# Patient Record
Sex: Male | Born: 1980 | Race: Black or African American | Hispanic: No | Marital: Single | Smoking: Current every day smoker
Health system: Southern US, Community
[De-identification: ages and names within clinical notes are randomized; demographics above are authoritative.]

---

## 2006-09-07 ENCOUNTER — Emergency Department (HOSPITAL_COMMUNITY): Admission: EM | Admit: 2006-09-07 | Discharge: 2006-09-07 | Payer: Self-pay | Admitting: Emergency Medicine

## 2007-12-17 ENCOUNTER — Emergency Department (HOSPITAL_COMMUNITY): Admission: EM | Admit: 2007-12-17 | Discharge: 2007-12-17 | Payer: Self-pay | Admitting: Emergency Medicine

## 2007-12-18 ENCOUNTER — Emergency Department (HOSPITAL_COMMUNITY): Admission: EM | Admit: 2007-12-18 | Discharge: 2007-12-18 | Payer: Self-pay | Admitting: Emergency Medicine

## 2009-05-21 IMAGING — CT CT ABDOMEN W/O CM
2 of 4 series · 14 of 32 positions shown, 19 images · non-contrast
Comparison: None

CT ABDOMEN

CLINICAL DATA: Abdominal pain and vomiting.

CT ABDOMEN AND PELVIS
TECHNIQUE: Multidetector CT imaging of the abdomen and pelvis was
performed using the standard protocol

[Series 2: >200 lbs stone · axial · 0.70mm/px · z∈[-470,-90]mm · 8 of 98 slices shown, 13 images]
[im 11/98  soft-tissue]
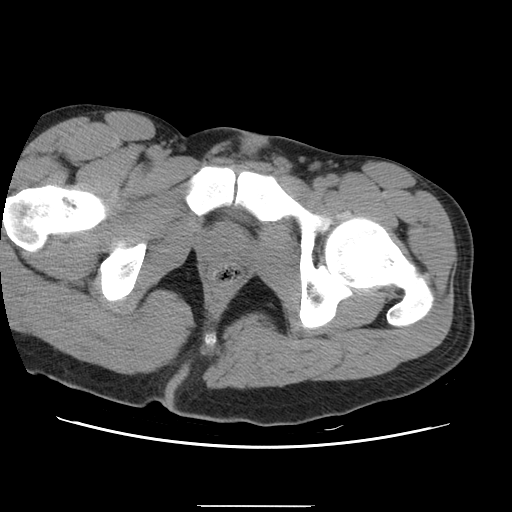
[im 11/98  bone]
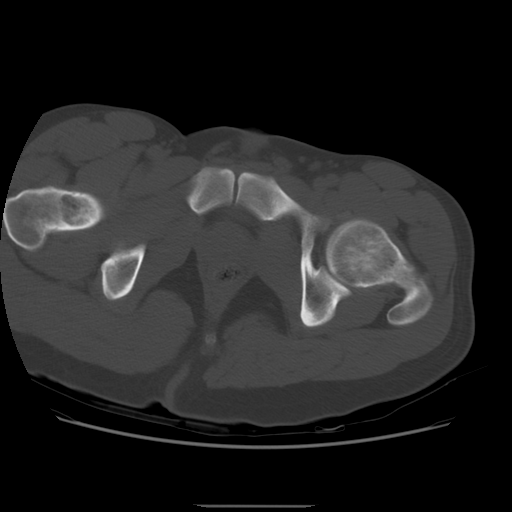
[im 22/98  soft-tissue]
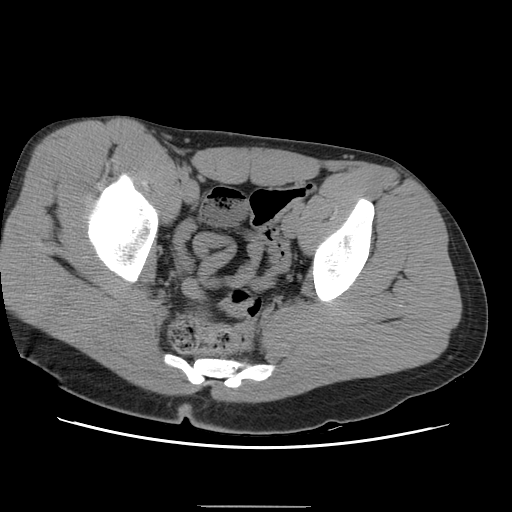
[im 33/98  soft-tissue]
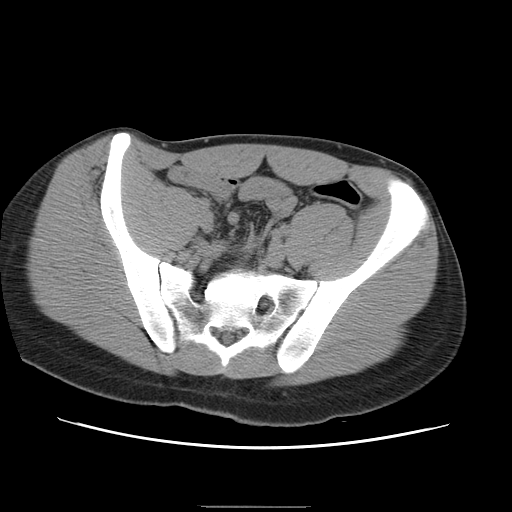
[im 44/98  soft-tissue]
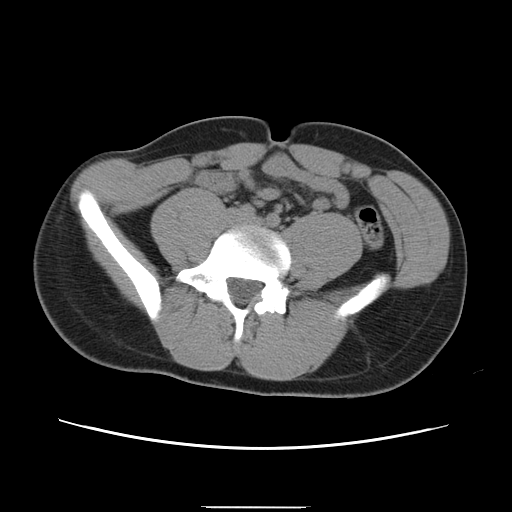
[im 54/98  soft-tissue]
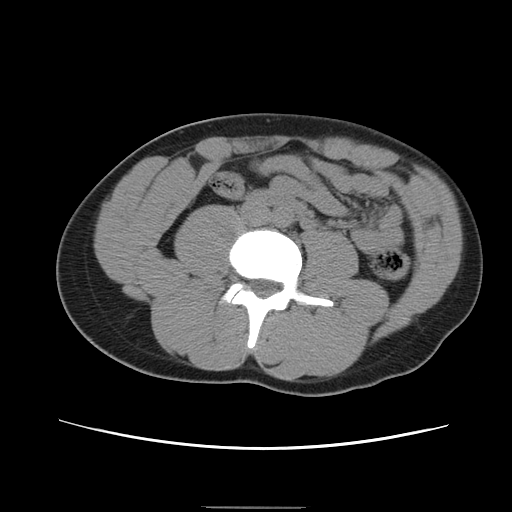
[im 54/98  lung]
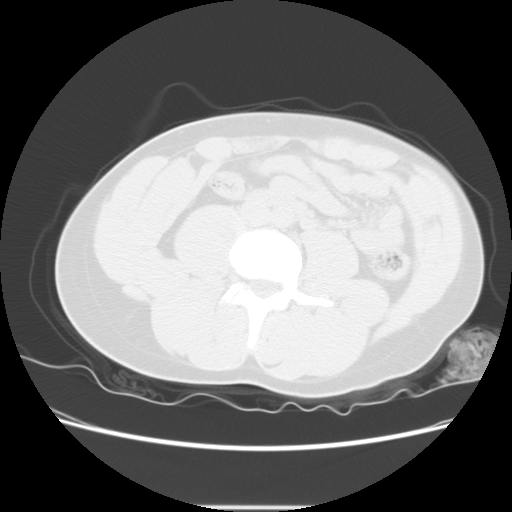
[im 65/98  soft-tissue]
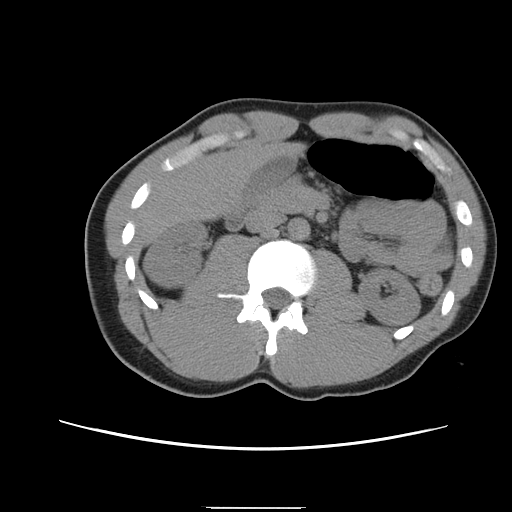
[im 65/98  lung]
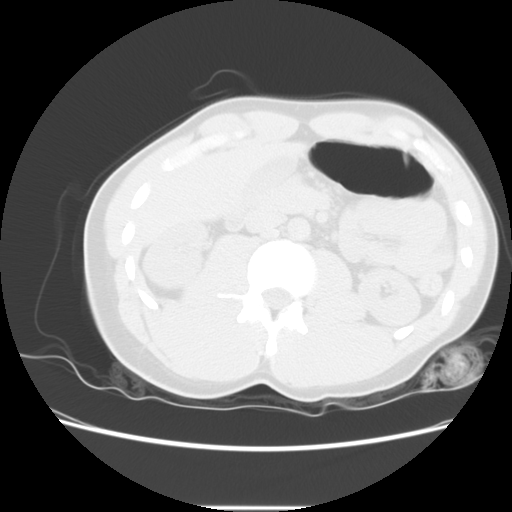
[im 76/98  soft-tissue]
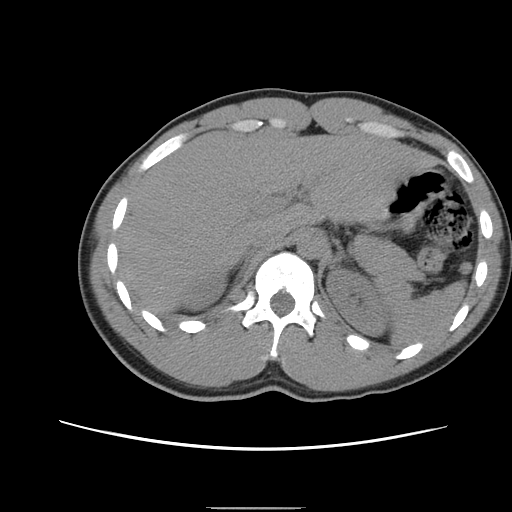
[im 76/98  lung]
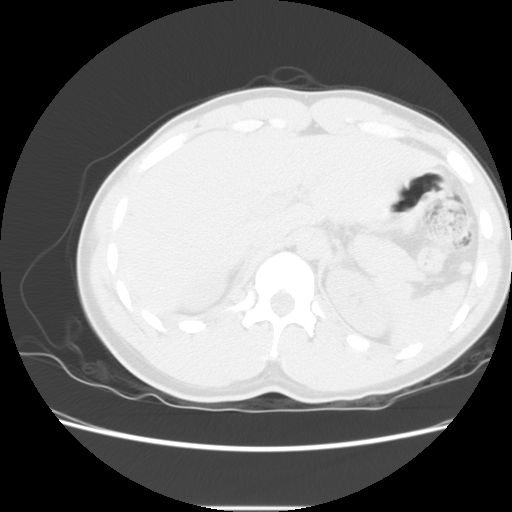
[im 87/98  soft-tissue]
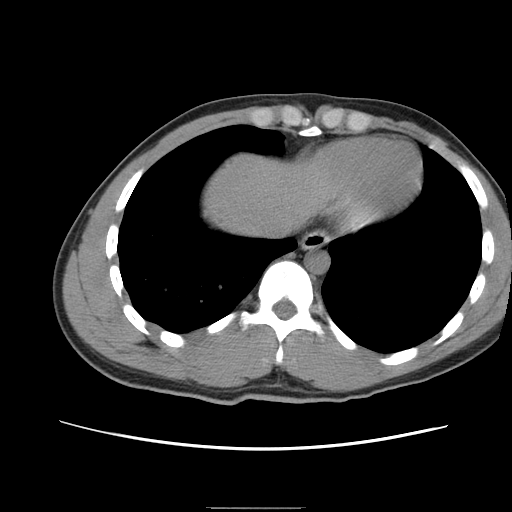
[im 87/98  lung]
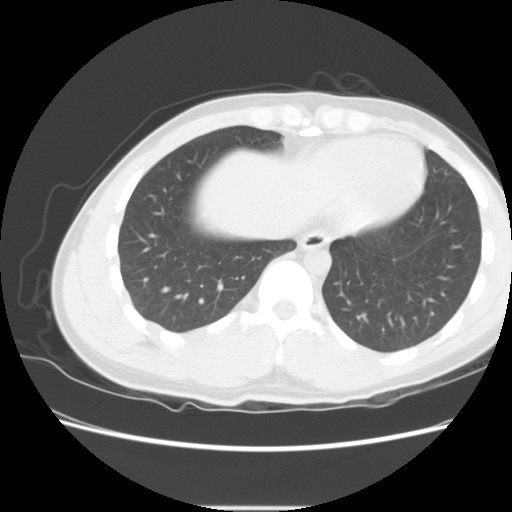

[Series 400: reformatted · sagittal · 0.99mm/px · 6 of 104 slices shown]
[im 11/104  soft-tissue]
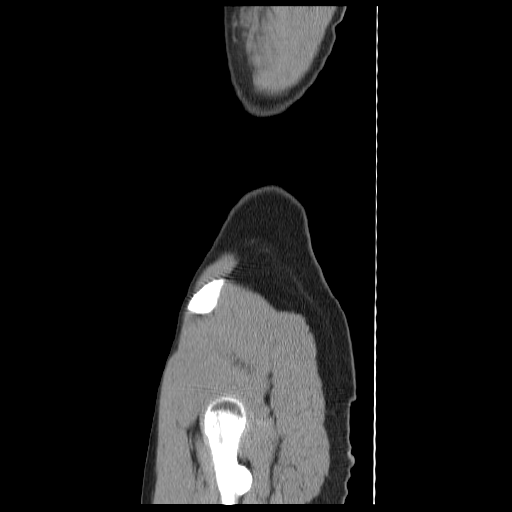
[im 21/104  soft-tissue]
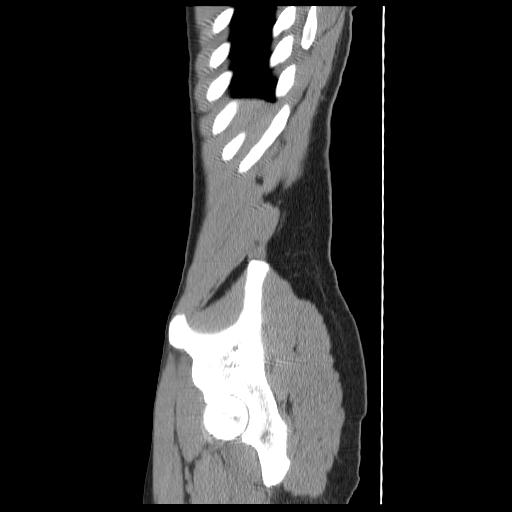
[im 31/104  soft-tissue]
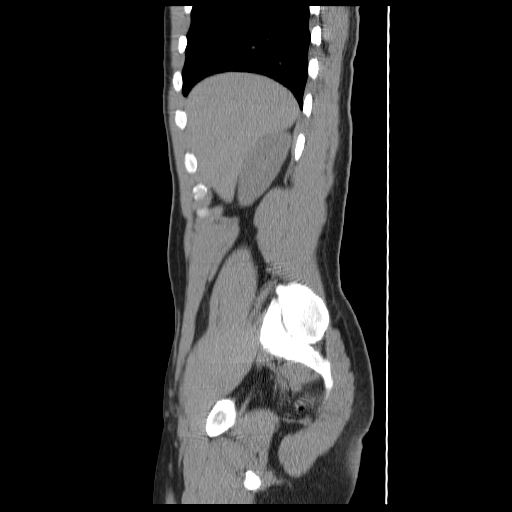
[im 42/104  soft-tissue]
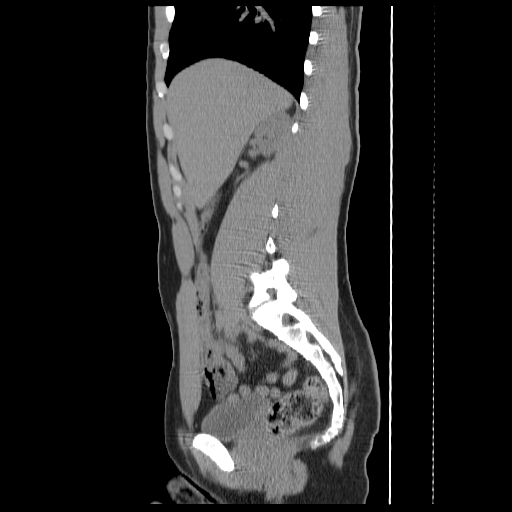
[im 62/104  soft-tissue]
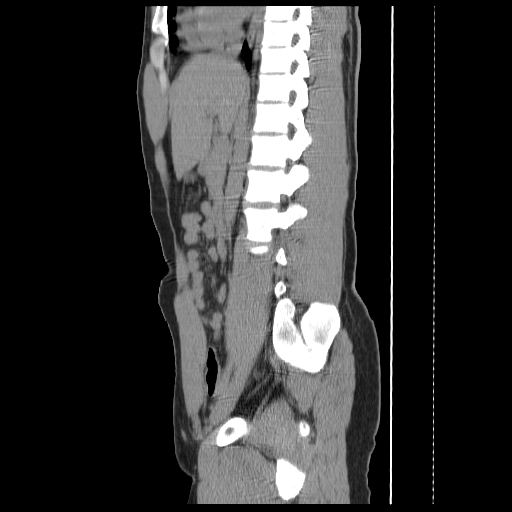
[im 73/104  soft-tissue]
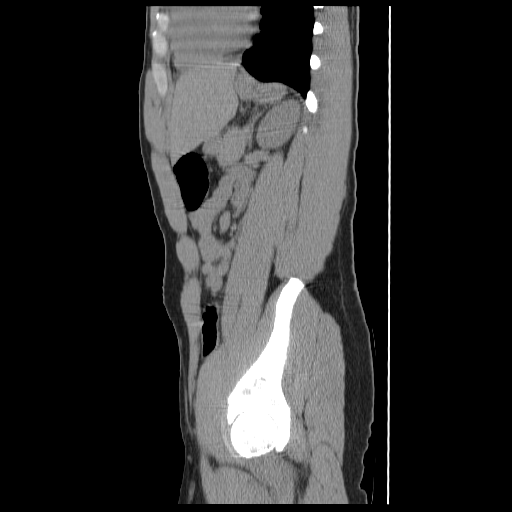

[14 of 32 positions shown; findings below may reference images not displayed]

FINDINGS: Liver, spleen, pancreas, adrenal glands, and kidneys
normal. Gallbladder unremarkable by CT. No biliary ductal dilation.
Stomach and visualized large and small bowel unremarkable.
Abdominal aorta normal in caliber. No significant lymphadenopathy.
No free fluid. Visualized lung bases clear.
IMPRESSION: Normal CT of the abdomen.

CT PELVIS
FINDINGS: Visualized colon and small bowel unremarkable. No free
fluid. No significant lymphadenopathy. Urinary bladder normal.
IMPRESSION: Normal CT of the pelvis.

## 2011-04-06 LAB — CBC
HCT: 49.1
HCT: 56.3 — ABNORMAL HIGH
Hemoglobin: 18.8 — ABNORMAL HIGH
MCHC: 34.6
MCV: 96.1
Platelets: 189
Platelets: 207
RDW: 13.2
RDW: 13.3
WBC: 13 — ABNORMAL HIGH

## 2011-04-06 LAB — DIFFERENTIAL
Basophils Absolute: 0.1
Basophils Relative: 0
Eosinophils Relative: 0
Eosinophils Relative: 0
Lymphocytes Relative: 14
Lymphs Abs: 1.9
Monocytes Absolute: 1
Monocytes Absolute: 1.2 — ABNORMAL HIGH
Monocytes Relative: 6
Neutro Abs: 13.1 — ABNORMAL HIGH
Neutro Abs: 9.9 — ABNORMAL HIGH
Neutrophils Relative %: 82 — ABNORMAL HIGH

## 2011-04-06 LAB — POCT I-STAT, CHEM 8
Calcium, Ion: 1.11 — ABNORMAL LOW
Chloride: 101
Glucose, Bld: 100 — ABNORMAL HIGH
Glucose, Bld: 144 — ABNORMAL HIGH
HCT: 52
HCT: 61 — ABNORMAL HIGH
Hemoglobin: 17.7 — ABNORMAL HIGH
Hemoglobin: 20.7 — ABNORMAL HIGH
Potassium: 3.8
Sodium: 135
Sodium: 137

## 2011-04-06 LAB — URINE MICROSCOPIC-ADD ON

## 2011-04-06 LAB — HEPATIC FUNCTION PANEL
ALT: 27
Bilirubin, Direct: 0.3
Total Bilirubin: 1.3 — ABNORMAL HIGH
Total Protein: 9.1 — ABNORMAL HIGH

## 2011-04-06 LAB — URINALYSIS, ROUTINE W REFLEX MICROSCOPIC
Leukocytes, UA: NEGATIVE
Specific Gravity, Urine: 1.037 — ABNORMAL HIGH
Urobilinogen, UA: 0.2
pH: 5.5

## 2011-05-02 ENCOUNTER — Emergency Department: Payer: Self-pay | Admitting: Emergency Medicine

## 2011-05-03 ENCOUNTER — Emergency Department (HOSPITAL_COMMUNITY)
Admission: EM | Admit: 2011-05-03 | Discharge: 2011-05-03 | Disposition: A | Discharge status: Home / Self Care | Payer: Self-pay | Attending: Emergency Medicine | Admitting: Emergency Medicine

## 2011-05-03 DIAGNOSIS — R630 Anorexia: Secondary | ICD-10-CM | POA: Insufficient documentation

## 2011-05-03 DIAGNOSIS — K644 Residual hemorrhoidal skin tags: Secondary | ICD-10-CM | POA: Insufficient documentation

## 2011-05-03 DIAGNOSIS — K625 Hemorrhage of anus and rectum: Secondary | ICD-10-CM | POA: Insufficient documentation

## 2011-05-09 ENCOUNTER — Ambulatory Visit (INDEPENDENT_AMBULATORY_CARE_PROVIDER_SITE_OTHER): Payer: Self-pay | Admitting: General Surgery

## 2011-05-09 ENCOUNTER — Encounter (INDEPENDENT_AMBULATORY_CARE_PROVIDER_SITE_OTHER): Payer: Self-pay | Admitting: General Surgery

## 2011-05-09 DIAGNOSIS — K648 Other hemorrhoids: Secondary | ICD-10-CM

## 2011-05-09 DIAGNOSIS — K644 Residual hemorrhoidal skin tags: Secondary | ICD-10-CM

## 2011-05-09 NOTE — Assessment & Plan Note (Deleted)
Injected circumferential internal hemorrhoids x3 Aggressive treatment of constipation. Proctofoam. Follow up in 6 weeks.

## 2011-05-09 NOTE — Assessment & Plan Note (Signed)
Injected circumferential internal hemorrhoids x3 Aggressive treatment of constipation. Reviewed staying off toilet for long periods of time.   Proctofoam. Follow up in 6 weeks.

## 2011-05-09 NOTE — Patient Instructions (Signed)
Take 100 mg Colace (stool softener) twice/day. Take Miralax (laxative) 17 gm once/day if stools are still hard or if straining to have BM. Drink 8 glasses water/day. Do not sit on toilet for long periods of time. Sitz baths (warm water soaks 2x/day)

## 2011-05-09 NOTE — Progress Notes (Signed)
Chief Complaint  Patient presents with  . Rectal Problems    Eval of hemmorhoids    HISTORY: The patient presents with around one year of complaints of hemorrhoids. He has had significant swelling that has gotten worse over the past 4 weeks. He complains of bleeding, pain, and swelling. He denies any rectal trauma. He has had significant constipation. Since going to the emergency department several weeks ago he has been feeling a little better. He started on stool softener once a day. He is still having constipation and still having to strain to have bowel movements. He is also on the ProctoFoam with some improvement but still has some bleeding. He denies any episodes of severe pain or any hard knots around his anus.  History reviewed. No pertinent past medical history.  History reviewed. No pertinent past surgical history.  Current Outpatient Prescriptions  Medication Sig Dispense Refill  . pramoxine (PROCTOFOAM) 1 % foam Place 1 application rectally 3 (three) times daily.           No Known Allergies   History reviewed. No pertinent family history.   History   Social History  . Marital Status: Single    Spouse Name: N/A    Number of Children: N/A  . Years of Education: N/A   Social History Main Topics  . Smoking status: Current Everyday Smoker -- 0.5 packs/day  . Smokeless tobacco: Never Used  . Alcohol Use: No  . Drug Use: No  . Sexually Active: None   Other Topics Concern  . None   Social History Narrative  . None     REVIEW OF SYSTEMS - PERTINENT POSITIVES ONLY: 12 point review of systems negative other than HPI and PMH.  EXAM: Filed Vitals:   05/09/11 1029  BP: 122/88  Pulse: 80  Temp: 97.2 F (36.2 C)  Resp: 14    Gen:  No acute distress.  Well nourished and well groomed.   Neurological: Alert and oriented to person, place, and time. Coordination normal.  Head: Normocephalic and atraumatic. Long hair in dredlocks.   Eyes: Conjunctivae are normal.  Pupils are equal, round, and reactive to light. No scleral icterus.  Neck: Normal range of motion.  Cardiovascular: Normal rate. Respiratory: Effort normal.  No respiratory distress.  GI:  The abdomen is soft and nontender.  There is no rebound and no guarding.  Rectal:  Circumferential large inflamed internal and external hemorrhoids.  Internal ones are injected with sclerosant during anoscopy.  No rectal masses.  No sign of thrombosis.   Musculoskeletal: Normal range of motion. Extremities are nontender.  Skin: Skin is warm and dry. No rash noted. No diaphoresis. No erythema. No pallor. No clubbing, cyanosis, or edema.   Psychiatric: Normal mood and affect. Behavior is normal. Judgment and thought content normal.    ASSESSMENT AND PLAN:   Internal and external bleeding hemorrhoids Injected circumferential internal hemorrhoids x3 Aggressive treatment of constipation. Reviewed staying off toilet for long periods of time.   Proctofoam. Follow up in 6 weeks.     Maudry Diego MD Surgical Oncology, General and Endocrine Surgery [facility]      Visit Diagnoses: 1. Hemorrhoids, internal, with bleeding   2. Internal and external bleeding hemorrhoids     Primary Care Physician: No primary provider on file.

## 2011-06-19 ENCOUNTER — Encounter (INDEPENDENT_AMBULATORY_CARE_PROVIDER_SITE_OTHER): Payer: Self-pay | Admitting: General Surgery

## 2011-07-17 ENCOUNTER — Ambulatory Visit (INDEPENDENT_AMBULATORY_CARE_PROVIDER_SITE_OTHER): Payer: PRIVATE HEALTH INSURANCE | Admitting: General Surgery

## 2011-07-17 ENCOUNTER — Encounter (INDEPENDENT_AMBULATORY_CARE_PROVIDER_SITE_OTHER): Payer: Self-pay | Admitting: General Surgery

## 2011-07-17 VITALS — BP 118/78 | HR 68 | Temp 97.6°F | Resp 18 | Ht 73.0 in | Wt 205.2 lb

## 2011-07-17 DIAGNOSIS — K644 Residual hemorrhoidal skin tags: Secondary | ICD-10-CM

## 2011-07-17 DIAGNOSIS — K648 Other hemorrhoids: Secondary | ICD-10-CM

## 2011-07-17 MED ORDER — HYDROCORTISONE 2.5 % RE CREA: TOPICAL_CREAM | Freq: Two times a day (BID) | RECTAL | Status: AC

## 2011-07-17 MED ORDER — PRAMOXINE HCL 1 % RE FOAM: 1.0000 "application " | Freq: Three times a day (TID) | RECTAL | Status: DC

## 2011-07-17 NOTE — Patient Instructions (Signed)
Hemorrhoids  Hemorrhoids are enlarged (dilated) veins around the rectum. There are 2 types of hemorrhoids, and the type of hemorrhoid is determined by its location. Internal hemorrhoids occur in the veins just inside the rectum.They are usually not painful, but they may bleed.However, they may poke through to the outside and become irritated and painful. External hemorrhoids involve the veins outside the anus and can be felt as a painful swelling or hard lump near the anus.They are often itchy and may crack and bleed. Sometimes clots will form in the veins. This makes them swollen and painful. These are called thrombosed hemorrhoids. CAUSES Causes of hemorrhoids include:  Pregnancy. This increases the pressure in the hemorrhoidal veins.   Constipation.   Straining to have a bowel movement.   Obesity.   Heavy lifting or other activity that caused you to strain.   TREATMENT Most of the time hemorrhoids improve in 1 to 2 weeks. However, if symptoms do not seem to be getting better or if you have a lot of rectal bleeding, your caregiver may perform a procedure to help make the hemorrhoids get smaller or remove them completely.Possible treatments include:  Rubber band ligation. A rubber band is placed at the base of the hemorrhoid to cut off the circulation.   Sclerotherapy. A chemical is injected to shrink the hemorrhoid.   Infrared light therapy. Tools are used to burn the hemorrhoid.   Hemorrhoidectomy. This is surgical removal of the hemorrhoid.   HOME CARE INSTRUCTIONS   Increase fiber in your diet. Ask your caregiver about using fiber supplements.   Drink enough water and fluids to keep your urine clear or pale yellow.   Exercise regularly.   Go to the bathroom when you have the urge to have a bowel movement. Do not wait.   Avoid straining to have bowel movements.   Keep the anal area dry and clean.   Only take over-the-counter or prescription medicines for pain,  discomfort, or fever as directed by your caregiver.   Take warm sitz baths for 20 to 30 minutes, 3 to 4 times per day.   If the hemorrhoids are very tender and swollen, place ice packs on the area as tolerated. Using ice packs between sitz baths may be helpful. Fill a plastic bag with ice. Place a towel between the bag of ice and your skin.   Medicated creams and suppositories may be used or applied as directed.   Do not use a donut-shaped pillow or sit on the toilet for long periods. This increases blood pooling and pain.   SEEK MEDICAL CARE IF:   You have increasing pain and swelling that is not controlled with your medicine.   You have uncontrolled bleeding.   You have difficulty or you are unable to have a bowel movement.   You have pain or inflammation outside the area of the hemorrhoids.   You have chills or an oral temperature above 102 F (38.9 C).     

## 2011-07-17 NOTE — Progress Notes (Signed)
Chief Complaint  Patient presents with  . Follow-up    Hemorrhoids    HISTORY: The patient is doing much better.  He states that he is around 70% better.  He is no longer having bleeding or pain.  He is having swelling still, and some issues cleaning.  He is taking his stool softeners daily.  He is doing sitz baths.  He overall is feeling better.    Past Medical History  Diagnosis Date  . Hemorrhoids     History reviewed. No pertinent past surgical history.  Current Outpatient Prescriptions  Medication Sig Dispense Refill  . hydrocortisone (ANUSOL-HC) 2.5 % rectal cream Place rectally 2 (two) times daily. Apply externally BID  30 g  3  . pramoxine (PROCTOFOAM) 1 % foam Place 1 application rectally 3 (three) times daily.  15 g  3     No Known Allergies   History reviewed. No pertinent family history.   History   Social History  . Marital Status: Single    Spouse Name: N/A    Number of Children: N/A  . Years of Education: N/A   Social History Main Topics  . Smoking status: Current Everyday Smoker -- 0.5 packs/day  . Smokeless tobacco: Never Used  . Alcohol Use: No  . Drug Use: No  . Sexually Active: None    REVIEW OF SYSTEMS - PERTINENT POSITIVES ONLY: 12 point review of systems negative other than HPI and PMH.  EXAM: Filed Vitals:   07/17/11 1350  BP: 118/78  Pulse: 68  Temp: 97.6 F (36.4 C)  Resp: 18    Gen:  No acute distress.  Well nourished and well groomed.   Neurological: Alert and oriented to person, place, and time. Coordination normal.  Head: Normocephalic and atraumatic. Long hair in dredlocks.   Eyes: Conjunctivae are normal. Pupils are equal, round, and reactive to light. No scleral icterus.  Neck: Normal range of motion.  Respiratory: Effort normal.  No respiratory distress.  Rectal:  Anoscopy is not performed today given improvement.  The external exam reveals circumferential external hemorrhoids, improved since last exam.  These are still  slightly inflamed appearing.   Skin: Skin is warm and dry. No rash noted. No diaphoresis. No erythema. No pallor. No clubbing, cyanosis, or edema.   Psychiatric: Normal mood and affect. Behavior is normal. Judgment and thought content normal.    ASSESSMENT AND PLAN:   Internal and external bleeding hemorrhoids Bleeding resolved. Will refill proctofoam. Pt with significant component of external hemorrhoids now.   Will write for anusol cream for external hemorrhoids. Continue stool softeners and sitz baths. Follow up as needed.      Maudry Diego MD Surgical Oncology, General and Endocrine Surgery [facility]      Visit Diagnoses: 1. Internal and external bleeding hemorrhoids     Primary Care Physician: Provider Not In System

## 2011-07-17 NOTE — Assessment & Plan Note (Signed)
Bleeding resolved. Will refill proctofoam. Pt with significant component of external hemorrhoids now.   Will write for anusol cream for external hemorrhoids. Continue stool softeners and sitz baths. Follow up as needed.

## 2012-06-20 ENCOUNTER — Emergency Department (HOSPITAL_COMMUNITY)
Admission: EM | Admit: 2012-06-20 | Discharge: 2012-06-20 | Disposition: A | Discharge status: Home / Self Care | Payer: Self-pay | Attending: Emergency Medicine | Admitting: Emergency Medicine

## 2012-06-20 ENCOUNTER — Encounter (HOSPITAL_COMMUNITY): Payer: Self-pay | Admitting: Emergency Medicine

## 2012-06-20 DIAGNOSIS — F17200 Nicotine dependence, unspecified, uncomplicated: Secondary | ICD-10-CM | POA: Insufficient documentation

## 2012-06-20 DIAGNOSIS — Z8679 Personal history of other diseases of the circulatory system: Secondary | ICD-10-CM | POA: Insufficient documentation

## 2012-06-20 DIAGNOSIS — K029 Dental caries, unspecified: Secondary | ICD-10-CM | POA: Insufficient documentation

## 2012-06-20 MED ORDER — HYDROCODONE-ACETAMINOPHEN 5-325 MG PO TABS
2.0000 | ORAL_TABLET | Freq: Once | ORAL | Status: AC
  Administered 2012-06-20: 2 via ORAL
  Filled 2012-06-20: qty 2

## 2012-06-20 MED ORDER — PENICILLIN V POTASSIUM 250 MG PO TABS
500.0000 mg | ORAL_TABLET | Freq: Four times a day (QID) | ORAL | Status: DC
  Administered 2012-06-20: 500 mg via ORAL
  Filled 2012-06-20: qty 2

## 2012-06-20 MED ORDER — PENICILLIN V POTASSIUM 500 MG PO TABS: 500.0000 mg | ORAL_TABLET | Freq: Three times a day (TID) | ORAL | Status: DC

## 2012-06-20 MED ORDER — HYDROCODONE-ACETAMINOPHEN 5-325 MG PO TABS: 2.0000 | ORAL_TABLET | ORAL | Status: DC | PRN

## 2012-06-20 NOTE — ED Notes (Signed)
Pt c/o right upper dental pain x 3 days.

## 2012-06-20 NOTE — ED Provider Notes (Signed)
History   This chart was scribed for Doug Sou, MD by Leone Payor, ED Scribe. This patient was seen in room TR10C/TR10C and the patient's care was started at 7:25PM.   CSN: [id number]  Arrival date & time 06/20/12  1811   None     Chief Complaint  Patient presents with  . Dental Pain    The history is provided by the patient. No language interpreter was used.   Randy Gregory is a 31 y.o. male who presents to the Emergency Department complaining of constant, gradually worsening, moderate right upper dental pain starting 3 days ago. Pt states taking taking Advil, ibuprofen, benadryl with no relief. Pt denies any fever. Pt denies having seen a dentist in the last few years.   Pt is a current everyday smoker but denies alcohol use. Denies using recreational drugs.  Past Medical History  Diagnosis Date  . Hemorrhoids     History reviewed. No pertinent past surgical history.  History reviewed. No pertinent family history.  History  Substance Use Topics  . Smoking status: Current Every Day Smoker -- 0.5 packs/day  . Smokeless tobacco: Never Used  . Alcohol Use: No      Review of Systems  Constitutional: Negative.  Negative for fever.  HENT: Positive for dental problem.   Respiratory: Negative.   Cardiovascular: Negative.   Gastrointestinal: Negative.   Musculoskeletal: Negative.   Skin: Negative.   Neurological: Negative.   Hematological: Negative.   Psychiatric/Behavioral: Negative.     Allergies  Review of patient's allergies indicates no known allergies.  Home Medications   Current Outpatient Rx  Name  Route  Sig  Dispense  Refill  . ACETAMINOPHEN 500 MG PO TABS   Oral   Take 1,000 mg by mouth every 6 (six) hours as needed. For pain         . ASPIRIN-ACETAMINOPHEN-CAFFEINE 250-250-65 MG PO TABS   Oral   Take 1 tablet by mouth every 6 (six) hours as needed. For pain         . BENZOCAINE 10 % MT GEL   Mouth/Throat   Use as directed 1 application  in the mouth or throat 3 (three) times daily as needed. For pain           BP 132/75  Pulse 116  Temp 98.6 F (37 C) (Oral)  Resp 18  SpO2 96%  Physical Exam  Nursing note and vitals reviewed. Constitutional: He appears well-developed and well-nourished.  HENT:  Head: Normocephalic and atraumatic.       Badly decayed right upper back 2 molars. No trismus.  Slight swelling of surrounding gingiva.   Eyes: Conjunctivae normal are normal. Pupils are equal, round, and reactive to light.  Neck: Neck supple. No tracheal deviation present. No thyromegaly present.  Cardiovascular: Normal rate and regular rhythm.   No murmur heard. Pulmonary/Chest: Effort normal and breath sounds normal.  Abdominal: Soft. Bowel sounds are normal. He exhibits no distension. There is no tenderness.  Musculoskeletal: Normal range of motion. He exhibits no edema and no tenderness.  Neurological: He is alert. Coordination normal.  Skin: Skin is warm and dry. No rash noted.  Psychiatric: He has a normal mood and affect.    ED Course  Procedures (including critical care time)  DIAGNOSTIC STUDIES: Oxygen Saturation is 96% on room air, adequate by my interpretation.    COORDINATION OF CARE:     Labs Reviewed - No data to display No results found.   No diagnosis  found.    MDM  Plan prescription Vicodin, penicillin Dental referral Dr.Beitez Diagnosis dental caries       I personally performed the services described in this documentation, which was scribed in my presence. The recorded information has been reviewed and is accurate.    Doug Sou, MD 06/20/12 2009

## 2013-10-29 ENCOUNTER — Encounter (HOSPITAL_COMMUNITY): Payer: Self-pay | Admitting: Emergency Medicine

## 2013-10-29 ENCOUNTER — Emergency Department (HOSPITAL_COMMUNITY)
Admission: EM | Admit: 2013-10-29 | Discharge: 2013-10-29 | Disposition: A | Discharge status: Home / Self Care | Payer: Self-pay | Attending: Emergency Medicine | Admitting: Emergency Medicine

## 2013-10-29 DIAGNOSIS — K0889 Other specified disorders of teeth and supporting structures: Secondary | ICD-10-CM

## 2013-10-29 DIAGNOSIS — K002 Abnormalities of size and form of teeth: Secondary | ICD-10-CM | POA: Insufficient documentation

## 2013-10-29 DIAGNOSIS — K089 Disorder of teeth and supporting structures, unspecified: Secondary | ICD-10-CM | POA: Insufficient documentation

## 2013-10-29 DIAGNOSIS — F17200 Nicotine dependence, unspecified, uncomplicated: Secondary | ICD-10-CM | POA: Insufficient documentation

## 2013-10-29 DIAGNOSIS — K029 Dental caries, unspecified: Secondary | ICD-10-CM | POA: Insufficient documentation

## 2013-10-29 MED ORDER — IBUPROFEN 800 MG PO TABS: 800.0000 mg | ORAL_TABLET | Freq: Three times a day (TID) | ORAL | Status: DC

## 2013-10-29 MED ORDER — PENICILLIN V POTASSIUM 500 MG PO TABS: 500.0000 mg | ORAL_TABLET | Freq: Four times a day (QID) | ORAL | Status: DC

## 2013-10-29 NOTE — ED Provider Notes (Signed)
Medical screening examination/treatment/procedure(s) were performed by non-physician practitioner and as supervising physician I was immediately available for consultation/collaboration.   EKG Interpretation None        Laray AngerKathleen M Murel Wigle, DO 10/29/13 1925

## 2013-10-29 NOTE — Discharge Instructions (Signed)
Take ibuprofen as needed for pain. Take penicillin as directed until gone. Follow up at the dental clinic. Refer to attached documents for more information.    Emergency Department Resource Guide 1) Find a Doctor and Pay Out of Pocket Although you won't have to find out who is covered by your insurance plan, it is a good idea to ask around and get recommendations. You will then need to call the office and see if the doctor you have chosen will accept you as a new patient and what types of options they offer for patients who are self-pay. Some doctors offer discounts or will set up payment plans for their patients who do not have insurance, but you will need to ask so you aren't surprised when you get to your appointment.  2) Contact Your Local Health Department Not all health departments have doctors that can see patients for sick visits, but many do, so it is worth a call to see if yours does. If you don't know where your local health department is, you can check in your phone book. The CDC also has a tool to help you locate your state's health department, and many state websites also have listings of all of their local health departments.  3) Find a Walk-in Clinic If your illness is not likely to be very severe or complicated, you may want to try a walk in clinic. These are popping up all over the country in pharmacies, drugstores, and shopping centers. They're usually staffed by nurse practitioners or physician assistants that have been trained to treat common illnesses and complaints. They're usually fairly quick and inexpensive. However, if you have serious medical issues or chronic medical problems, these are probably not your best option.  No Primary Care Doctor: - Call Health Connect at  [phone] - they can help you locate a primary care doctor that  accepts your insurance, provides certain services, etc. - Physician Referral Service- [phone]  Chronic Pain Problems: Organization          Address  Phone   Notes  [facility]  [phone]) [phone] Patients need to be referred by their primary care doctor.   Medication Assistance: Organization         Address  Phone   Notes  [facility] Medication [facility] [address] [place], [place] [address] [phone]) [phone] --Must be a resident of [facility] [place] -- Must have NO insurance coverage whatsoever (no Medicaid/ Medicare, etc.) -- The pt. MUST have a primary care doctor that directs their care regularly and follows them in the community   MedAssist  [phone]   [employer]  [phone]) [phone]    Agencies that provide inexpensive medical care: Organization         Address  Phone   Notes  [facility]  [phone]) [phone]   [facility]    [phone]   [facility] [address] [place], [place] [address] [phone]   [facility] [address], [place] [phone]   Planned Parenthood    [phone]   [facility]    [phone]   [facility]  [address], [place] Phone:  [phone], Fax:  [phone] Hours of Operation:  9 am - 6 pm, M-F.  Also accepts Medicaid/Medicare and self-pay.  [facility]  [address],  [place] Phone: [phone](336) [phone], Fax: [phone]) [phone]. Hours of Operation:  8:30 am - 5:30 pm, M-F.  Also accepts Medicaid and self-pay.  [facility] [place] [address], [place] Phone: [phone]) [phone]   [facility] [address], [place], [place] [phone], Ext. [phone] Mondays & Thursdays: 7-9 AM.  First 15 patients are seen on a first come, first serve basis.    Medicaid-accepting [facility] Providers:  Organization         Address  Phone   Notes  [facility] [address], [place] [phone]) [phone] Also accepts self-pay patients.    [facility] [address], [place]  [phone]   [facility] [address], [place] [phone]) [phone]   [facility] [address] [place] [address], [place] [phone]   Renaye RakersVeita Bland [address], [place]   [phone] Only accepts [place] Access [place]icaid patients after they have their name applied to their card.   Self-Pay (no insurance) in [facility] [place]:  Organization         Address  Phone   Notes  Sickle Cell Patients, [facility] [address], [place] [phone]) [phone]   [facility] [address], [place] [phone]) [phone]   [facility] [place]  [address], [place] [phone]   [facility]/Dr. Osei-Bonsu  [address] [place] [address], [place] or [id number], [place] [phone] Phone number for both [place] and [place] locations is the same.  [facility] [address], [place] [phone]) [phone]   [facility] [place] [address] [place] [address], [place] or [address] [phone]   [facility] [address], [place] [phone]) [phone], phone; [phone]) [phone], fax Sees patients 1st and 3rd Saturday of every month.  Must not qualify for public or private insurance (i.e. Medicaid, Medicare, [place] Health Choice, Veterans' Benefits)  Household income should be no more than 200% of the poverty level The clinic cannot treat you if you are pregnant or think you are pregnant  Sexually transmitted diseases are not treated at the clinic.    Dental Care: Organization         Address  Phone  Notes  [facility] Department of [facility] [address], [place] [phone] Accepts children up to age 33 who are enrolled in [place]icaid or [place] Health Choice; pregnant women with a Medicaid  card; and children who have applied for Medicaid or [place] Health Choice, but were declined, whose parents can pay a reduced fee at time of service.  [facility] of [facility] [place]  [address], [place] [phone] Accepts children up to age 10821 who are enrolled in [place]icaid or [place] Health Choice; pregnant women with a Medicaid card; and children who have applied for Medicaid or [place] Health Choice, but were declined, whose parents can pay a reduced fee at time of service.  [facility]  [address], [place] [phone] Patients are seen by appointment only. Walk-ins are not accepted. [facility] will see patients 33 years of age and older. Monday - Tuesday (8am-5pm) Most Wednesdays (8:30-5pm) $30 per visit, cash only  [facility]  [address], [facility] [phone]) [phone] Patients are seen by appointment only. Walk-ins are not accepted. [facility] will see patients 33 years of age and older. One Wednesday Evening (Monthly: Volunteer Based).  $30 per visit, cash only  [employer]  [phone] for adults; Children under age 724, call Graduate Pediatric Dentistry at [phone]. Children aged 244-14, please call [phone] to request a pediatric application.  Dental services are provided in all areas of dental care including fillings, crowns and bridges, complete and partial dentures, implants, gum treatment, root canals, and extractions. Preventive care is also provided. Treatment is provided to both adults and children. Patients are selected via a lottery and there is often a waiting list.   [facility] [address], [place]  [phone] [email or web address]   [facility] [address], [place], [place] [phone], Ext. [phone] Second and Fourth Thursday of each month, opens at 6:30 AM; Clinic ends at 9 AM.  Patients are seen on a first-come  first-served basis, and a limited number are seen during each clinic.   [facility]  [address], [place], [place] [phone]) [phone]   Eligibility Requirements You must have lived in [place], [place], or [place] counties for at least the last three months.   You cannot be eligible for state or federal sponsored National Cityhealthcare insurance, including CIGNAVeterans Administration, [place]icaid, or [employer] EntertainmentMedicare.   You generally cannot be eligible for healthcare insurance through your employer.    How to apply: Eligibility screenings are held every Tuesday and Wednesday afternoon from 1:00 pm until 4:00 pm. You do not need an appointment for the interview!  [facility] [address], [place], [place] [phone]   [facility]  [phone]   [facility]  [phone]   [facility]  [phone]    Behavioral Health Resources in the Community: Intensive Outpatient Programs Organization         Address  Phone  Notes  [facility] [address], [place], [place] [phone]   [facility] [address], [place], [place] [phone]   [facility] [address], [place], [place]  [phone]   [facility] [address],  [place], [place] [phone] or [phone]   Substance Abuse Resources Organization         Address  Phone  Notes  [facility]  [phone]   [facility]  [phone]   [facility]  [phone]   [facility]  [phone]   Residential & Outpatient Substance Abuse Program  [phone]   Psychological Services Organization         Address  Phone  Notes  [facility]  [phone]- [phone]   [facility]  [phone]- [phone]   [facility] [address], [place] [phone] or [phone]    Mobile Crisis Teams Organization          Address  Phone  Notes  [facility], Mobile Crisis Care Unit  [phone]   [facility]  [address] [place], [place] [phone]   Doristine LocksSharon DeEsch [address] [place] [phone]    Self-Help/Support Groups Organization         Address  Phone             Notes  [facility]ental Health Assoc. of [place] -  variety of support groups  [phone] Call for more information  Narcotics Anonymous (NA), [facility] [address], [place]  2 meetings at this location   Residential Facilities manager         Address  Phone  Notes  [facility] [place],    [place]  [phone]   [facility]  [address] [place] [phone], [place], [place]   [place], [place] [phone] Admissions: 8am-3pm M-F  Incentives Substance [place] [address]    [place], [place] [phone]   [facility] [address] [place], [place], [place]   [facility] [address]  [place], [place]   [facility]ight Programs - Intensive Outpatient [address], [place], [place]   [facility] [address].,  [place], [place] [phone] or [phone]   [facility] (RTS) [address], [place], [place] Accepts Medicaid  [facility] [place] [address],  [place] [phone] Substance Abuse/Addiction Treatment   [facility] Organization         Address  Phone  Notes  [facility]  [phone]   Domenic Schwab, PhD [address] [place], [place]   [phone] or [phone]   [place] [address] [place], [place] [phone]   [facility] [address] [place], [place] [phone] Insurance/Medicaid/sponsorship  through [facility] and Families [address] [place]                                    [place], [place] [phone] [place] [address], [place] [phone]    Dr. Adele Schilder  [phone]   [facility] Dept. 1) [address]  2) [place] 3)  [place] [address], [place] [phone]  [phone]   [place] [phone] or [phone] (After Hours)

## 2013-10-29 NOTE — ED Provider Notes (Signed)
CSN: [id number]     Arrival date & time 10/29/13  1509 History  This chart was scribed for non-physician practitioner working with Randy Gregory, by Tana ConchStephen Methvin ED Scribe. This patient [facility] and the patient's care was started at 3:43 PM.    Chief Complaint  Patient presents with  . Dental Pain      Patient is a 33 y.o. male presenting with tooth pain. The history is provided by the patient. No language interpreter was used.  Dental Pain Location:  Upper Quality:  Aching and constant Severity:  Moderate Onset quality:  Gradual Duration:  4 days Timing:  Rare Progression:  Worsening Chronicity:  Recurrent Relieved by:  Nothing Worsened by:  Nothing tried Ineffective treatments:  None tried Associated symptoms: no fever     HPI Comments: Randy Gregory is a 33 y.o. male who presents to the Emergency Department complaining of upper left molar pain that has been present for 3-4 days, it has "been a bad tooth for a while though, there is exposed nerve in there ". Pt denies fever. Pt has been sent to a dentist after being in the ED before.  Pt has been using [name] for the pain.   Past Medical History  Diagnosis Date  . Hemorrhoids    History reviewed. No pertinent past surgical history. No family history on file. History  Substance Use Topics  . Smoking status: Current Every Day Smoker -- 0.50 packs/day  . Smokeless tobacco: Never Used  . Alcohol Use: No    Review of Systems  Constitutional: Negative for fever and appetite change.  HENT: Positive for dental problem (left upper).   Psychiatric/Behavioral: Negative for confusion.  All other systems reviewed and are negative.     Allergies  Review of patient's allergies indicates no known allergies.  Home Medications   Prior to Admission medications   Not on File   BP 128/89  Pulse 78  Temp(Src) 98 F (36.7 C) (Oral)  Resp 14  Ht 6\' 2"  (1.88 m)  Wt 231 lb (104.781 kg)  BMI 29.65  kg/m2  SpO2 97% Physical Exam  Nursing note and vitals reviewed. Constitutional: He is oriented to person, place, and time. He appears well-developed and well-nourished. No distress.  HENT:  Head: Normocephalic and atraumatic.  Poor dentition Left upper molar decayed and tender to percussion.   Eyes: Conjunctivae are normal. Pupils are equal, round, and reactive to light.  Neck: Normal range of motion.  Cardiovascular: Normal rate and regular rhythm.  Exam reveals no gallop and no friction rub.   No murmur heard. Pulmonary/Chest: Effort normal and breath sounds normal. He has no wheezes. He has no rales. He exhibits no tenderness.  Musculoskeletal: Normal range of motion.  Neurological: He is alert and oriented to person, place, and time.  Speech is goal-oriented. Moves limbs without ataxia.   Skin: Skin is warm and dry.  Psychiatric: He has a normal mood and affect. His behavior is normal.    ED Course  Procedures (including critical care time)  DIAGNOSTIC STUDIES: Oxygen Saturation is 97% on RA, normal by my interpretation.    COORDINATION OF CARE:   3:46 PM-Discussed treatment plan which  includes seeing a dentist with pt at bedside and pt agreed to plan.   Labs Review Labs Reviewed - No data to display  Imaging Review No results found.   EKG Interpretation None      MDM   Final diagnoses:  Pain, dental  I personally performed the services described in this documentation, which was scribed in my presence. The recorded information has been reviewed and is accurate.   3:49 PM Patient will have penicillin for possible infection and ibuprofen for pain. Patient advised to follow up with dental clinic. Vitals stable and patient afebrile.    Randy BeckKaitlyn Tranice [place]uke, PA-C 10/29/13 1551

## 2013-10-29 NOTE — ED Notes (Signed)
Pt reports upper L molar pain for 3-4 days after eating cherry pie. Molar broke apporx 1 month ago. Pt denies fevers.

## 2015-05-25 ENCOUNTER — Encounter: Payer: Self-pay | Admitting: Internal Medicine

## 2018-10-10 ENCOUNTER — Emergency Department
Admission: EM | Admit: 2018-10-10 | Discharge: 2018-10-10 | Disposition: A | Discharge status: Home / Self Care | Payer: No Typology Code available for payment source | Attending: Emergency Medicine | Admitting: Emergency Medicine

## 2018-10-10 ENCOUNTER — Other Ambulatory Visit: Payer: Self-pay

## 2018-10-10 ENCOUNTER — Emergency Department: Payer: No Typology Code available for payment source

## 2018-10-10 ENCOUNTER — Encounter: Payer: Self-pay | Admitting: Emergency Medicine

## 2018-10-10 DIAGNOSIS — F17200 Nicotine dependence, unspecified, uncomplicated: Secondary | ICD-10-CM | POA: Diagnosis not present

## 2018-10-10 DIAGNOSIS — Y999 Unspecified external cause status: Secondary | ICD-10-CM | POA: Insufficient documentation

## 2018-10-10 DIAGNOSIS — S39012A Strain of muscle, fascia and tendon of lower back, initial encounter: Secondary | ICD-10-CM | POA: Diagnosis not present

## 2018-10-10 DIAGNOSIS — Y9389 Activity, other specified: Secondary | ICD-10-CM | POA: Insufficient documentation

## 2018-10-10 DIAGNOSIS — S29012A Strain of muscle and tendon of back wall of thorax, initial encounter: Secondary | ICD-10-CM | POA: Diagnosis not present

## 2018-10-10 DIAGNOSIS — Y92410 Unspecified street and highway as the place of occurrence of the external cause: Secondary | ICD-10-CM | POA: Diagnosis not present

## 2018-10-10 DIAGNOSIS — S3992XA Unspecified injury of lower back, initial encounter: Secondary | ICD-10-CM | POA: Diagnosis present

## 2018-10-10 MED ORDER — IBUPROFEN 600 MG PO TABS: 600.0000 mg | ORAL_TABLET | Freq: Three times a day (TID) | ORAL | 0 refills | Status: AC | PRN

## 2018-10-10 MED ORDER — IBUPROFEN 600 MG PO TABS: 600.0000 mg | ORAL_TABLET | Freq: Three times a day (TID) | ORAL | 0 refills | Status: DC | PRN
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
## 2018-10-10 NOTE — Discharge Instructions (Signed)
Follow-up with your primary care provider or [facility] clinic if any continued problems.  Begin taking ibuprofen 600 mg 3 times daily with food for muscle soreness.  You may use ice or heat to your muscles as needed for discomfort.  Soreness and stiffness usually last approximately 4 to 5 days.  Tried to continue moving as often as possible as lying still or sitting on the sofa will increase your stiffness.
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
## 2019-10-18 ENCOUNTER — Ambulatory Visit: Payer: No Typology Code available for payment source | Attending: Internal Medicine
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
